# Patient Record
Sex: Female | Born: 1980 | Hispanic: Yes | Marital: Married | Smoking: Never smoker
Health system: Southern US, Community
[De-identification: ages and names within clinical notes are randomized; demographics above are authoritative.]

## PROBLEM LIST (undated history)

## (undated) HISTORY — PX: TUBAL LIGATION: SHX77

---

## 2009-04-12 ENCOUNTER — Emergency Department: Payer: Self-pay | Admitting: Emergency Medicine

## 2009-04-13 ENCOUNTER — Emergency Department: Payer: Self-pay | Admitting: Emergency Medicine

## 2009-05-08 ENCOUNTER — Ambulatory Visit: Payer: Self-pay | Admitting: Family Medicine

## 2009-08-06 ENCOUNTER — Encounter: Payer: Self-pay | Admitting: Obstetrics and Gynecology

## 2009-11-26 ENCOUNTER — Inpatient Hospital Stay: Payer: Self-pay | Admitting: Obstetrics and Gynecology

## 2010-06-20 ENCOUNTER — Emergency Department: Payer: Self-pay | Admitting: Emergency Medicine

## 2010-11-04 ENCOUNTER — Emergency Department: Payer: Self-pay | Admitting: Emergency Medicine

## 2010-11-06 ENCOUNTER — Emergency Department: Payer: Self-pay | Admitting: Emergency Medicine

## 2011-08-30 ENCOUNTER — Emergency Department: Payer: Self-pay | Admitting: Emergency Medicine

## 2011-10-02 ENCOUNTER — Encounter: Payer: Self-pay | Admitting: Obstetrics and Gynecology

## 2011-10-30 ENCOUNTER — Emergency Department: Payer: Self-pay | Admitting: *Deleted

## 2011-10-30 LAB — HEMOGLOBIN: HGB: 11 g/dL — ABNORMAL LOW (ref 12.0–16.0)

## 2011-10-30 LAB — URINALYSIS, COMPLETE
Bacteria: NONE SEEN
Bilirubin,UR: NEGATIVE
Blood: NEGATIVE
Glucose,UR: NEGATIVE mg/dL (ref 0–75)
Ketone: NEGATIVE
Leukocyte Esterase: NEGATIVE
Nitrite: NEGATIVE
Ph: 7 (ref 4.5–8.0)
Protein: NEGATIVE
RBC,UR: <1 /HPF (ref 0–5)
Specific Gravity: 1.003 (ref 1.003–1.030)
Squamous Epithelial: <1
WBC UR: <1 /HPF (ref 0–5)

## 2011-10-30 LAB — BASIC METABOLIC PANEL
Anion Gap: 14 (ref 7–16)
BUN: 8 mg/dL (ref 7–18)
Calcium, Total: 8.7 mg/dL (ref 8.5–10.1)
Chloride: 107 mmol/L (ref 98–107)
Co2: 20 mmol/L — ABNORMAL LOW (ref 21–32)
Creatinine: 0.38 mg/dL — ABNORMAL LOW (ref 0.60–1.30)
EGFR (African American): >60
EGFR (Non-African Amer.): >60
Glucose: 108 mg/dL — ABNORMAL HIGH (ref 65–99)
Osmolality: 280 (ref 275–301)
Potassium: 3.6 mmol/L (ref 3.5–5.1)
Sodium: 141 mmol/L (ref 136–145)

## 2011-10-30 LAB — HCG, QUANTITATIVE, PREGNANCY: Beta Hcg, Quant.: 17667 m[IU]/mL — ABNORMAL HIGH

## 2011-10-30 LAB — WET PREP, GENITAL

## 2012-01-06 ENCOUNTER — Encounter: Payer: Self-pay | Admitting: Pediatric Cardiology

## 2012-03-14 ENCOUNTER — Inpatient Hospital Stay: Payer: Self-pay | Admitting: Obstetrics and Gynecology

## 2012-03-14 LAB — CBC WITH DIFFERENTIAL/PLATELET
Basophil #: 0 10*3/uL (ref 0.0–0.1)
Basophil %: 0.2 %
Eosinophil #: 0 10*3/uL (ref 0.0–0.7)
Eosinophil %: 0.1 %
HCT: 39.7 % (ref 35.0–47.0)
HGB: 12.4 g/dL (ref 12.0–16.0)
Lymphocyte %: 3.7 %
MCHC: 31.4 g/dL — ABNORMAL LOW (ref 32.0–36.0)
Monocyte %: 3.5 %
Neutrophil #: 14.5 10*3/uL — ABNORMAL HIGH (ref 1.4–6.5)
Platelet: 231 10*3/uL (ref 150–440)
RBC: 5.42 10*6/uL — ABNORMAL HIGH (ref 3.80–5.20)

## 2012-03-15 LAB — HEMATOCRIT: HCT: 33.9 % — ABNORMAL LOW (ref 35.0–47.0)

## 2013-12-27 ENCOUNTER — Emergency Department: Payer: Self-pay | Admitting: Emergency Medicine

## 2015-01-09 ENCOUNTER — Emergency Department
Admission: EM | Admit: 2015-01-09 | Discharge: 2015-01-09 | Disposition: A | Discharge status: Home / Self Care | Payer: Self-pay | Attending: Emergency Medicine | Admitting: Emergency Medicine

## 2015-01-09 ENCOUNTER — Encounter: Payer: Self-pay | Admitting: Emergency Medicine

## 2015-01-09 DIAGNOSIS — R339 Retention of urine, unspecified: Secondary | ICD-10-CM | POA: Insufficient documentation

## 2015-01-09 DIAGNOSIS — R338 Other retention of urine: Secondary | ICD-10-CM

## 2015-01-09 DIAGNOSIS — Z3A08 8 weeks gestation of pregnancy: Secondary | ICD-10-CM | POA: Insufficient documentation

## 2015-01-09 DIAGNOSIS — O9989 Other specified diseases and conditions complicating pregnancy, childbirth and the puerperium: Secondary | ICD-10-CM | POA: Insufficient documentation

## 2015-01-09 LAB — URINALYSIS COMPLETE WITH MICROSCOPIC (ARMC ONLY)
BILIRUBIN URINE: NEGATIVE
Bacteria, UA: NONE SEEN
Glucose, UA: NEGATIVE mg/dL
HGB URINE DIPSTICK: NEGATIVE
Ketones, ur: NEGATIVE mg/dL
Leukocytes, UA: NEGATIVE
Nitrite: NEGATIVE
PH: 7 (ref 5.0–8.0)
PROTEIN: NEGATIVE mg/dL
Specific Gravity, Urine: 1.005 (ref 1.005–1.030)

## 2015-01-09 NOTE — ED Notes (Signed)
Placed a leg bad per Dr. Manson PasseyBrown orders.

## 2015-01-09 NOTE — ED Notes (Signed)
 urine emptied from foley drainage bag; pt reports immediate relief of pain; pt st hx of urinary retention in past and was wearing a foley for 2weeks

## 2015-01-09 NOTE — ED Notes (Signed)
Pt verbalizes understanding of discharge instructions.

## 2015-01-09 NOTE — ED Notes (Signed)
Bladder scan indicates >[id number]

## 2015-01-09 NOTE — Discharge Instructions (Signed)
Retencin urinaria aguda (Acute Urinary Retention) La retencin urinaria aguda es la incapacidad transitoria para Geographical information systems officerorinar. Es un trastorno poco frecuente Allstateen las mujeres. Las causas pueden ser:  Infeccin.  Efectos secundarios de Nature conservation officerun medicamento.  Un problema en alguno de los rganos de la zona que presionan la vejiga o la uretra (el conducto que permite la salida de la orina de la vejiga)  Problemas psicolgicos.   Ciruga en la vejiga, uretra o los rganos plvicos que causan una obstruccin en el flujo de orina de la vejiga. INSTRUCCIONES PARA EL CUIDADO EN EL HOGAR  Si van a enviarlo a su casa con un catter Foley y un sistema de drenaje, necesitar resolver cul ser el mejor curso de accin junto con su mdico. Mientras el catter est colocado, mantenga una buena ingesta de lquidos. Mantenga la bolsa de drenaje vaca y en posicin ms baja que el catter. De este modo, la orina contaminada no fluir hacia atrs, hacia la vejiga, lo que podra causar una infeccin urinaria. Hay dos tipos principales de bolsa de Lime Ridgedrenaje. Una es una bolsa grande que generalmente se utiliza por la noche. Tiene una buena capacidad, lo que permitir dormir toda la noche sin Teaching laboratory techniciantener que vaciarla. El segundo tipo se llama bolsa de pierna. Tiene menos capacidad por lo tanto necesita vaciarse con ms frecuencia. Sin embargo, la ventaja principal es que puede adherirse con una correa a la pierna y puede ir debajo de la ropa, permitiendo la libertad para moverse o salir de su casa. Slo tome medicamentos de venta libre o recetados para Primary school teachercalmar el dolor, Environmental health practitionerel malestar o bajar la [place]bre, segn las indicaciones de su mdico.  WrenSOLICITE ATENCIN MDICA SI:  Tiene fiebre baja.  Siente espasmos o prdidas de orina con los espasmos. SOLICITE ATENCIN MDICA DE INMEDIATO SI:   Siente escalofros o tiene fiebre.  El catter deja de drenar [place].  El catter se Pension scheme managersale del lugar.  Comienza a tener un aumento del sangrado que no  responde al reposo ni al aumento de la ingesta de lquidos. ASEGRESE DE QUE:  Comprende estas instrucciones.  Controlar su afeccin.  Recibir ayuda de inmediato si no mejora o si empeora. Document Released: 03/01/2007 Document Revised: 06/08/2013 [facility] Patient Information 2015 [place], [place]. This information is not intended to replace advice given to you by your health care provider. Make sure you discuss any questions you have with your health care provider.

## 2015-01-09 NOTE — ED Notes (Addendum)
Patient ambulatory to triage with steady gait, without difficulty or distress noted; pt reports difficulty urinating since Sunday with pelvic pain; pt reports EDC 12/12[facility]; G3 P2; denies any complications; denies vag bleeding or discharge; denies abd pain; pt st LMP 3/7, has had no u/s

## 2015-01-09 NOTE — ED Provider Notes (Signed)
[facility] Emergency Department Provider Note  ____________________________________________  Time seen: 5:00AM  I have reviewed the triage vital signs and the nursing notes.   HISTORY  Chief Complaint Urinary Retention      HPI Allison Perez is a 34 y.o. female   presents with inability to urinate since 11AM yesterday. Patient admits to similar episode of the same with previous pregnancies of note patient states that she is [redacted] weeks pregnant. Patient denies any dysuria patient denies any hematuria.     History reviewed. No pertinent past medical history.  There are no active problems to display for this patient.   History reviewed. No pertinent past surgical history.  No current outpatient prescriptions on file.  Allergies Review of patient's allergies indicates no known allergies.  History reviewed. No pertinent family history.  Social History History  Substance Use Topics  . Smoking status: Never Smoker   . Smokeless tobacco: Not on file  . Alcohol Use: No    Review of Systems  Constitutional: Negative for fever. Eyes: Negative for visual changes. ENT: Negative for sore throat. Cardiovascular: Negative for chest pain. Respiratory: Negative for shortness of breath. Gastrointestinal: Negative for abdominal pain, vomiting and diarrhea. Genitourinary: Positive for urinary retention Musculoskeletal: Negative for back pain. Skin: Negative for rash. Neurological: Negative for headaches, focal weakness or numbness.   10-point ROS otherwise negative.  ____________________________________________   PHYSICAL EXAM:  VITAL SIGNS: ED Triage Vitals  Enc Vitals Group     BP --      Pulse --      Resp --      Temp --      Temp src --      SpO2 --      Weight --      Height --      Head Cir --      Peak Flow --      Pain Score --      Pain Loc --      Pain Edu? --      Excl. in GC? --      Constitutional: Alert and  oriented. Well appearing and in no distress. Eyes: Conjunctivae are normal. PERRL. Normal extraocular movements. ENT   Head: Normocephalic and atraumatic.   Nose: No congestion/rhinnorhea.   Mouth/Throat: Mucous membranes are moist.   Neck: No stridor. Hematological/Lymphatic/Immunilogical: No cervical lymphadenopathy. Cardiovascular: Normal rate, regular rhythm. Normal and symmetric distal pulses are present in all extremities. No murmurs, rubs, or gallops. Respiratory: Normal respiratory effort without tachypnea nor retractions. Breath sounds are clear and equal bilaterally. No wheezes/rales/rhonchi. Gastrointestinal: Soft and nontender. No distention. There is no CVA tenderness. Positive suprapubic discomfort Genitourinary: deferred Musculoskeletal: Nontender with normal range of motion in all extremities. No joint effusions.  No lower extremity tenderness nor edema. Neurologic:  Normal speech and language. No gross focal neurologic deficits are appreciated. Speech is normal.  Skin:  Skin is warm, dry and intact. No rash noted. Psychiatric: Mood and affect are normal. Speech and behavior are normal. Patient exhibits appropriate insight and judgment.  ____________________________________________    LABS (pertinent positives/negatives)  Labs Reviewed  URINALYSIS COMPLETEWITH MICROSCOPIC ([facility])  - Abnormal; Notable for the following:    Color, Urine COLORLESS (*)    APPearance CLEAR (*)    Squamous Epithelial / LPF 0-5 (*)    All other components within normal limits     ____________________________________________   ____________________________________________    ____________________________________________   INITIAL IMPRESSION / ASSESSMENT  AND PLAN / ED COURSE  Pertinent labs & imaging results that were available during my care of the patient were reviewed by me and considered in my medical decision making (see chart for details).  Given history and  physical exam consistent with urinary retention patient had a Foley catheter placed with greater than 1000 mL of urine. Given large quantity of urine and previous episode of the same full catheter left in place and patient fitted with leg bag patient will be referred to Dr. Dalbert GarnetBeasley of note patient states she has a OB/GYN appointment on Thursday [facility].  ____________________________________________   FINAL CLINICAL IMPRESSION(S) / ED DIAGNOSES  Final diagnoses:  Acute urinary retention      Darci Currentandolph N Arabel Barcenas, MD 01/09/15 [phone]

## 2015-01-09 NOTE — H&P (Signed)
L&D Evaluation:  History:   HPI 34 y/o G3P1011 @ 39wks EDC  03/18/12 arrived with strong contractions and urge to push. Care @ [facility] + trich  RX'd GBS negative.See delivery record.    Presents with contractions    Patient's Medical History No Chronic Illness    Patient's Surgical History none    Medications Pre Natal Vitamins    Allergies NKDA    Social History none    Family History Non-Contributory   ROS:   ROS All systems were reviewed.  HEENT, CNS, GI, GU, Respiratory, CV, Renal and Musculoskeletal systems were found to be normal.   Exam:   Vital Signs stable    Urine Protein not completed    General appropriate    Mental Status clear    Chest clear    Heart normal sinus rhythm    Abdomen gravid, non-tender    Estimated Fetal Weight Large for gestational age    Fetal Position vtx    Fundal Height erm    Back no CVAT    Edema no edema    Reflexes 1+    Clonus negative    Pelvic complete with BBOW nl show vtx @ -1 station    Mebranes 0932 AROM clear    Description clear    FHT normal rate with no decels, 130's-140's avg variabillity, variable decels with peak of uc's    Fetal Heart Rate 136    Ucx regular, Q552min 60 sec strong    Skin dry    Lymph no lymphadenopathy   Impression:   Impression active labor   Plan:   Plan Delivery    Comments See delivery summary   Electronic Signatures: Albertina ParrLugiano, Ellsie Violette B (CNM)  (Signed 14-Jul-13 10:28)  Authored: L&D Evaluation   Last Updated: 14-Jul-13 10:28 by Albertina ParrLugiano, Arthurine Oleary B (CNM)

## 2018-02-02 ENCOUNTER — Emergency Department
Admission: EM | Admit: 2018-02-02 | Discharge: 2018-02-02 | Disposition: A | Discharge status: Home / Self Care | Payer: Self-pay | Attending: Emergency Medicine | Admitting: Emergency Medicine

## 2018-02-02 ENCOUNTER — Encounter: Payer: Self-pay | Admitting: Emergency Medicine

## 2018-02-02 ENCOUNTER — Other Ambulatory Visit: Payer: Self-pay

## 2018-02-02 DIAGNOSIS — B373 Candidiasis of vulva and vagina: Secondary | ICD-10-CM | POA: Insufficient documentation

## 2018-02-02 DIAGNOSIS — B3731 Acute candidiasis of vulva and vagina: Secondary | ICD-10-CM

## 2018-02-02 LAB — WET PREP, GENITAL
Clue Cells Wet Prep HPF POC: NONE SEEN
SPERM: NONE SEEN
TRICH WET PREP: NONE SEEN

## 2018-02-02 LAB — CHLAMYDIA/NGC RT PCR (ARMC ONLY)
Chlamydia Tr: NOT DETECTED
N gonorrhoeae: NOT DETECTED

## 2018-02-02 MED ORDER — FLUCONAZOLE 50 MG PO TABS
150.0000 mg | ORAL_TABLET | Freq: Once | ORAL | Status: AC
  Administered 2018-02-02: 150 mg via ORAL
  Filled 2018-02-02: qty 1

## 2018-02-02 NOTE — ED Provider Notes (Signed)
[facility] Emergency Department Provider Note   First MD Initiated Contact with Patient 02/02/18 [phone]     (approximate)  I have reviewed the triage vital signs and the nursing notes.   HISTORY  Chief Complaint Vaginal Itching    HPI Allison Perez is a 37 y.o. female presents to the emergency department with thick yellowish-white vaginal discharge.  Patient denies any dysuria.  Patient denies any dyspareunia.  She admits to being sexually active with one partner.   Past medical history None There are no active problems to display for this patient.   Past Surgical History:  Procedure Laterality Date  . CESAREAN SECTION      Prior to Admission medications   Not on File    Allergies No known drug allergies No family history on file.  Social History Social History   Tobacco Use  . Smoking status: Never Smoker  . Smokeless tobacco: Never Used  Substance Use Topics  . Alcohol use: No  . Drug use: Not on file    Review of Systems Constitutional: No fever/chills Eyes: No visual changes. ENT: No sore throat. Cardiovascular: Denies chest pain. Respiratory: Denies shortness of breath. Gastrointestinal: No abdominal pain.  No nausea, no vomiting.  No diarrhea.  No constipation. Genitourinary: Negative for dysuria.  Positive for vaginal discharge Musculoskeletal: Negative for neck pain.  Negative for back pain. Integumentary: Negative for rash. Neurological: Negative for headaches, focal weakness or numbness.   ____________________________________________   PHYSICAL EXAM:  VITAL SIGNS: ED Triage Vitals  Enc Vitals Group     BP 02/02/18 0328 122/82     Pulse Rate 02/02/18 0328 80     Resp 02/02/18 0328 18     Temp 02/02/18 0328 98.6 F (37 C)     Temp Source 02/02/18 0328 Oral     SpO2 02/02/18 0328 97 %     Weight 02/02/18 0329 61.2 kg (135 lb)     Height --      Head Circumference --      Peak Flow --      Pain Score  02/02/18 0328 0     Pain Loc --      Pain Edu? --      Excl. in GC? --     Constitutional: Alert and oriented. Well appearing and in no acute distress. Eyes: Conjunctivae are normal.  Head: Atraumatic. Mouth/Throat: Mucous membranes are moist. Neck: No stridor.   Cardiovascular: Normal rate, regular rhythm. Good peripheral circulation. Grossly normal heart sounds. Respiratory: Normal respiratory effort.  No retractions. Lungs CTAB. Gastrointestinal: Soft and nontender. No distention.  Genitourinary: Thick white vaginal discharge consistent with yeast. Musculoskeletal: No lower extremity tenderness nor edema. No gross deformities of extremities. Neurologic:  Normal speech and language. No gross focal neurologic deficits are appreciated.  Skin:  Skin is warm, dry and intact. No rash noted.   ____________________________________________   LABS (all labs ordered are listed, but only abnormal results are displayed)  Labs Reviewed  WET PREP, GENITAL - Abnormal; Notable for the following components:      Result Value   Yeast Wet Prep HPF POC PRESENT (*)    WBC, Wet Prep HPF POC TOO NUMEROUS TO COUNT (*)    All other components within normal limits  CHLAMYDIA/NGC RT PCR ([facility] ONLY)    Procedures   ____________________________________________   INITIAL IMPRESSION / ASSESSMENT AND PLAN / ED COURSE  As part of my medical decision making, I reviewed the following  data within the electronic MEDICAL RECORD NUMBER  37 year old female presented with history and physical exam consistent with yeast vaginitis.  Patient given Diflucan in the emergency department. ____________________________________________  FINAL CLINICAL IMPRESSION(S) / ED DIAGNOSES  Final diagnoses:  Yeast vaginitis     MEDICATIONS GIVEN DURING THIS VISIT:  Medications  fluconazole (DIFLUCAN) tablet 150 mg (150 mg Oral Given 02/02/18 0451)     ED Discharge Orders    None       Note:  This document  was prepared using Dragon voice recognition software and may include unintentional dictation errors.    Darci CurrentBrown, [place] N, MD 02/03/18 [phone]

## 2018-02-02 NOTE — ED Notes (Signed)
ED Provider at bedside. 

## 2018-02-02 NOTE — ED Notes (Signed)
Patient c/o vaginal itching and discharge. Patient believes she may have a yeast infection. Patient reports hx of previous yeast infection in 2013, and reports this feels similar.

## 2018-02-02 NOTE — ED Notes (Signed)
This RN utilized the spanish interpreter to discuss discharge instructions. Reviewed discharge instructions and follow-up care with patient. Patient verbalized understanding of all information reviewed. Patient stable, with no distress noted at this time.

## 2018-02-02 NOTE — ED Triage Notes (Signed)
Patient ambulatory to triage with steady gait, without difficulty or distress noted; per interpreter, pt reports since yesterday having vaginal itching; denies urinary c/o

## 2018-02-02 NOTE — ED Notes (Signed)
This RN and MD Manson PasseyBrown utilized spanish interpreter to assess patient.

## 2019-07-22 ENCOUNTER — Other Ambulatory Visit: Payer: Self-pay

## 2019-07-22 DIAGNOSIS — Z20822 Contact with and (suspected) exposure to covid-19: Secondary | ICD-10-CM

## 2019-07-24 LAB — NOVEL CORONAVIRUS, NAA: SARS-CoV-2, NAA: DETECTED — AB

## 2020-09-25 ENCOUNTER — Ambulatory Visit: Payer: Self-pay | Attending: Oncology

## 2020-09-25 VITALS — BP 116/83 | HR 92 | Temp 98.7°F | Resp 20 | Ht 61.0 in | Wt 132.7 lb

## 2020-09-25 DIAGNOSIS — N644 Mastodynia: Secondary | ICD-10-CM

## 2020-09-25 DIAGNOSIS — N6452 Nipple discharge: Secondary | ICD-10-CM

## 2020-09-25 NOTE — Progress Notes (Signed)
  Subjective:     Patient ID: Allison Perez, female   DOB: [birth year], 40 y.o.   MRN: [id number]  HPI   Review of Systems     Objective:   Physical Exam     Assessment:         Plan:

## 2020-09-25 NOTE — Progress Notes (Signed)
  Subjective:     Patient ID: Allison Perez, female   DOB: 27-Dec-1980, 40 y.o.   MRN: [id number]  HPI   Review of Systems     Objective:   Physical Exam Chest:  Breasts:     Right: Nipple discharge present. No swelling, bleeding, inverted nipple, mass, skin change or tenderness.     Left: Nipple discharge and tenderness present. No swelling, bleeding, inverted nipple, mass or skin change.      Comments: Bilateral spontaneous nipple discharge; left retroareolar tenderness.         Assessment:     40 year old Hispanic patient presents for [facility] clinic visit. Jaqui Laukaitis interpreted exam.   Patient screened, and meets [facility] eligibility.  Patient does not have insurance, Medicare or Medicaid. Instructed patient on breast self awareness using teach back method. No prior mammogram.  Reports left retro areolar tenderness. On clinical breast exam observed spontaneous left nipple discharge, and was able to express bilateral milky discharge.  Patient states at times she has retro areolar pain and expressing decreases pain.  Instructed not to express discharge. But to report to her primary provider or [facility] nurse.  Denies pregnancy, States had tubal ligation.  Risk Assessment    Risk Scores      09/25/2020   Last edited by: Jim Like, RN   5-year risk: 0.4 %   Lifetime risk: 7.2 %            Plan:     Scheduled for baseline bilateral diagnostic mammogram and ultrasound on 10/04/20.

## 2020-10-04 ENCOUNTER — Other Ambulatory Visit: Payer: Self-pay

## 2020-10-04 ENCOUNTER — Ambulatory Visit
Admission: RE | Admit: 2020-10-04 | Discharge: 2020-10-04 | Disposition: A | Discharge status: Home / Self Care | Payer: Self-pay | Source: Ambulatory Visit | Attending: Oncology | Admitting: Oncology

## 2020-10-04 DIAGNOSIS — N6452 Nipple discharge: Secondary | ICD-10-CM | POA: Insufficient documentation

## 2020-10-04 DIAGNOSIS — N644 Mastodynia: Secondary | ICD-10-CM

## 2021-01-18 NOTE — Progress Notes (Signed)
Radiologist reviewed benign findings with patient.  To return for annual screening. Copy to [facility]. 

## 2022-07-28 ENCOUNTER — Other Ambulatory Visit: Payer: Self-pay

## 2022-07-28 DIAGNOSIS — Z1231 Encounter for screening mammogram for malignant neoplasm of breast: Secondary | ICD-10-CM

## 2022-07-29 ENCOUNTER — Ambulatory Visit: Payer: Self-pay | Attending: Hematology and Oncology | Admitting: Hematology and Oncology

## 2022-07-29 ENCOUNTER — Ambulatory Visit
Admission: RE | Admit: 2022-07-29 | Discharge: 2022-07-29 | Disposition: A | Discharge status: Home / Self Care | Payer: Self-pay | Source: Ambulatory Visit | Attending: Obstetrics and Gynecology | Admitting: Obstetrics and Gynecology

## 2022-07-29 VITALS — BP 106/71 | Wt 143.6 lb

## 2022-07-29 DIAGNOSIS — Z1231 Encounter for screening mammogram for malignant neoplasm of breast: Secondary | ICD-10-CM

## 2022-07-29 NOTE — Patient Instructions (Signed)
Taught Allison Perez about breast self awareness. Patient did not need a Pap smear today due to last Pap smear was in 2023 per patient. Let her know [facility] will cover Pap smears every 5 years unless has a history of abnormal Pap smears. [facility]. Appointment scheduled for 07/29/22. Patient aware of appointment and will be there. Let patient know will follow up with her within the next couple weeks with results. Allison Perez verbalized understanding. She will return to clinic in one year for mammogram and 3 years for Pap smear.   Allison Lux, NP 9:10 AM

## 2022-07-29 NOTE — Progress Notes (Signed)
Allison Perez is a 41 y.o. female who presents to [facility] clinic today with no complaints.    Pap Smear: Pap not smear completed today. Last Pap smear was 2023 at [facility] clinic and was normal. Per patient has no history of an abnormal Pap smear. Last Pap smear result is available in Epic.   Physical exam: Breasts Breasts symmetrical. No skin abnormalities bilateral breasts. No nipple retraction bilateral breasts. No nipple discharge bilateral breasts. No lymphadenopathy. No lumps palpated bilateral breasts.     MS DIGITAL DIAG TOMO BILAT  Result Date: 10/04/2020 CLINICAL DATA:  41 year old female presenting for evaluation bilateral chronic non spontaneous milky nipple discharge an intermittent retroareolar pain. The patient states that her nipple discharge has been occurring for 5 years since the birth of her child. The retroareolar pain has been chronic as well, and cycles with her menstrual cycle. Within the last month the pain persisted after her menstrual cycle. It has since resolved and she is asymptomatic today. EXAM: DIGITAL DIAGNOSTIC BILATERAL MAMMOGRAM WITH TOMO AND CAD TECHNIQUE: Bilateral digital diagnostic mammography and breast tomosynthesis was performed. Digital images of the breasts were evaluated with computer-aided detection. COMPARISON:  None. ACR Breast Density Category c: The breast tissue is heterogeneously dense, which may obscure small masses. FINDINGS: No suspicious calcifications, masses or areas of distortion are seen in the bilateral breasts. A BB has been placed on the inferior aspect of the left breast at the site where the patient was previously experiencing pain. No suspicious findings are seen deep to this marker. IMPRESSION: 1.  No mammographic evidence of malignancy in the bilateral breasts. RECOMMENDATION: 1. Further management of nipple discharge should be based on clinical assessment. This is felt to be physiologic/related to a benign process given that it is  bilateral, non spontaneous and milky in color. The patient was advised to alert her doctor if she experiences spontaneous unilateral bloody or clear nipple discharge from a single duct only. 2. Clinical follow-up recommended for the tender area of concern in the left breast. Any further workup should be based on clinical grounds. Ultrasound was not performed today as the patient is no longer experiencing the pain. If it recurs and is concerning, targeted ultrasound may be performed. I have discussed the findings and recommendations with the patient. If applicable, a reminder letter will be sent to the patient regarding the next appointment. BI-RADS CATEGORY  1: Negative. Electronically Signed   By: Allison Perez M.D.   On: 10/04/2020 11:06      Pelvic/Bimanual Pap is not indicated today    Smoking History: Patient has never smoked and was not referred to quit line.    Patient Navigation: Patient education provided. Access to services provided for patient through [facility] program. Allison Perez interpreter provided. No transportation provided   Colorectal Cancer Screening: Per patient has never had colonoscopy completed No complaints today.    Breast and Cervical Cancer Risk Assessment: Patient does not have family history of breast cancer, known genetic mutations, or radiation treatment to the chest before age 59. Patient does not have history of cervical dysplasia, immunocompromised, or DES exposure in-utero.  Risk Scores as of 07/29/2022     Allison Perez           5-year 0.83 %   Lifetime 12.25 %   This patient is Hispana/Latina but has no documented birth country, so the Home model used data from [place] patients to calculate their risk score. Document a birth country in the Demographics  activity for a more accurate score.         Last calculated by Allison Rutherford, LPN on 67/59/1638 at  9:01 AM        A: [facility] exam without pap smear No complaints with benign exam.    P: [facility]  for a screening mammogram. Appointment scheduled 07/29/22.  Allison Perez A, NP 07/29/2022 9:07 AM

## 2022-11-11 IMAGING — MG DIGITAL DIAGNOSTIC BILAT W/ TOMO W/ CAD
6 of 10 series · 6 of 30 positions shown · non-contrast
Comparison: None.

CLINICAL DATA: 39-year-old female presenting for evaluation
bilateral chronic non spontaneous milky nipple discharge an
intermittent retroareolar pain. The patient states that her nipple
discharge has been occurring for 5 years since the birth of her
child. The retroareolar pain has been chronic as well, and cycles
with her menstrual cycle. Within the last month the pain persisted
after her menstrual cycle. It has since resolved and she is
asymptomatic today.

EXAM:
DIGITAL DIAGNOSTIC BILATERAL MAMMOGRAM WITH TOMO AND CAD
TECHNIQUE: Bilateral digital diagnostic mammography and breast tomosynthesis
was performed. Digital images of the breasts were evaluated with
computer-aided detection.

[L MLO synth-2D (1 of 2)]
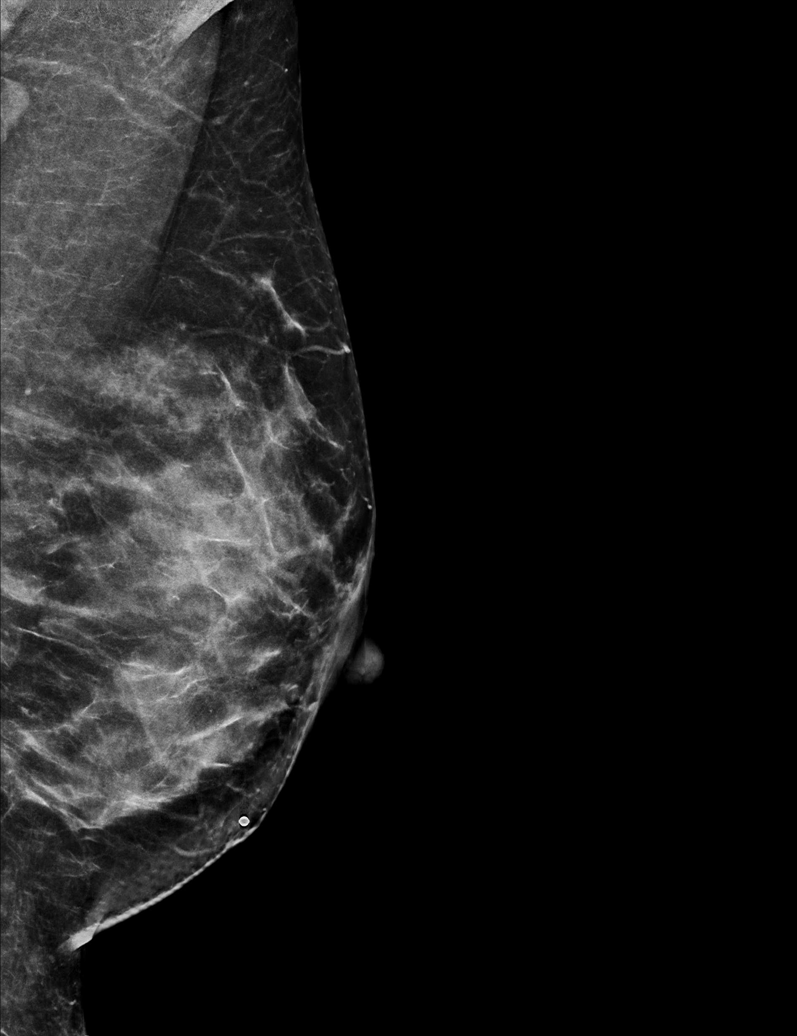

[L CC synth-2D]
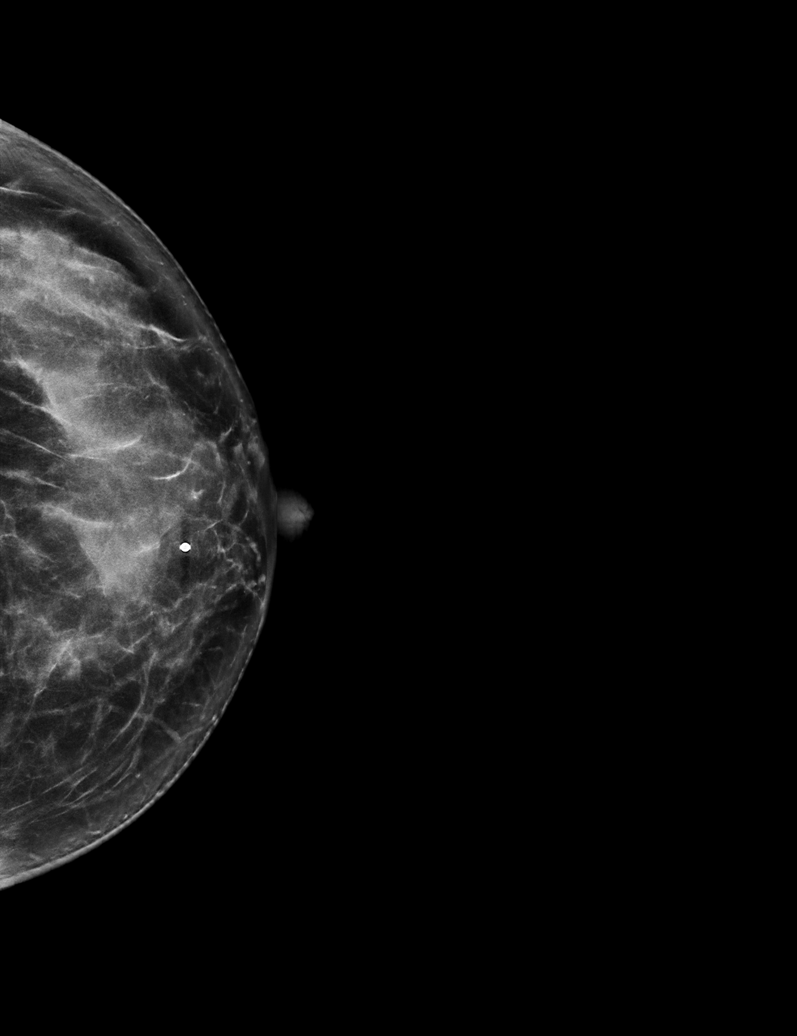

[R CC synth-2D]
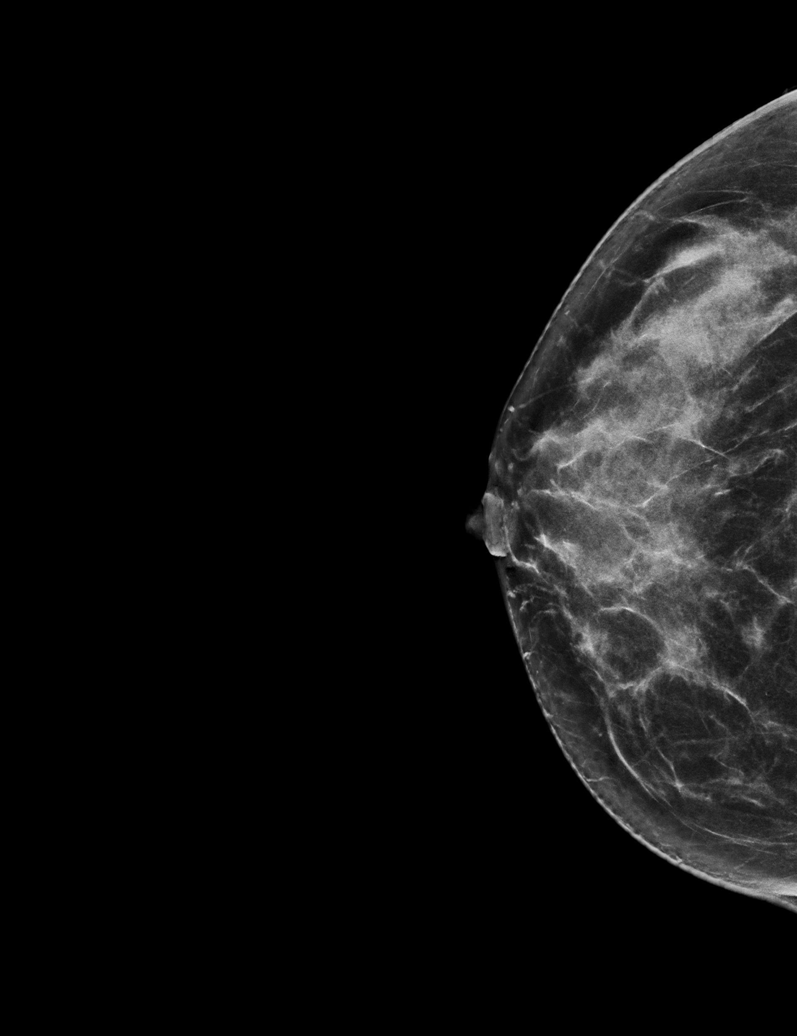

[L MLO synth-2D (2 of 2)]
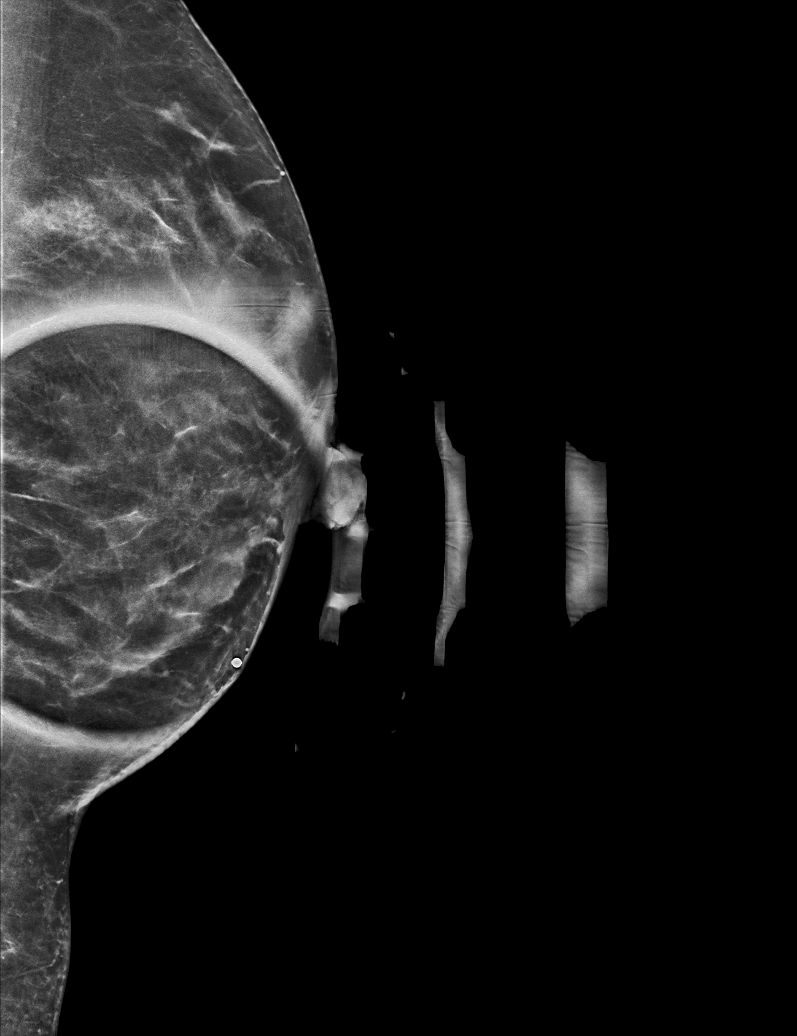

[R MLO synth-2D]
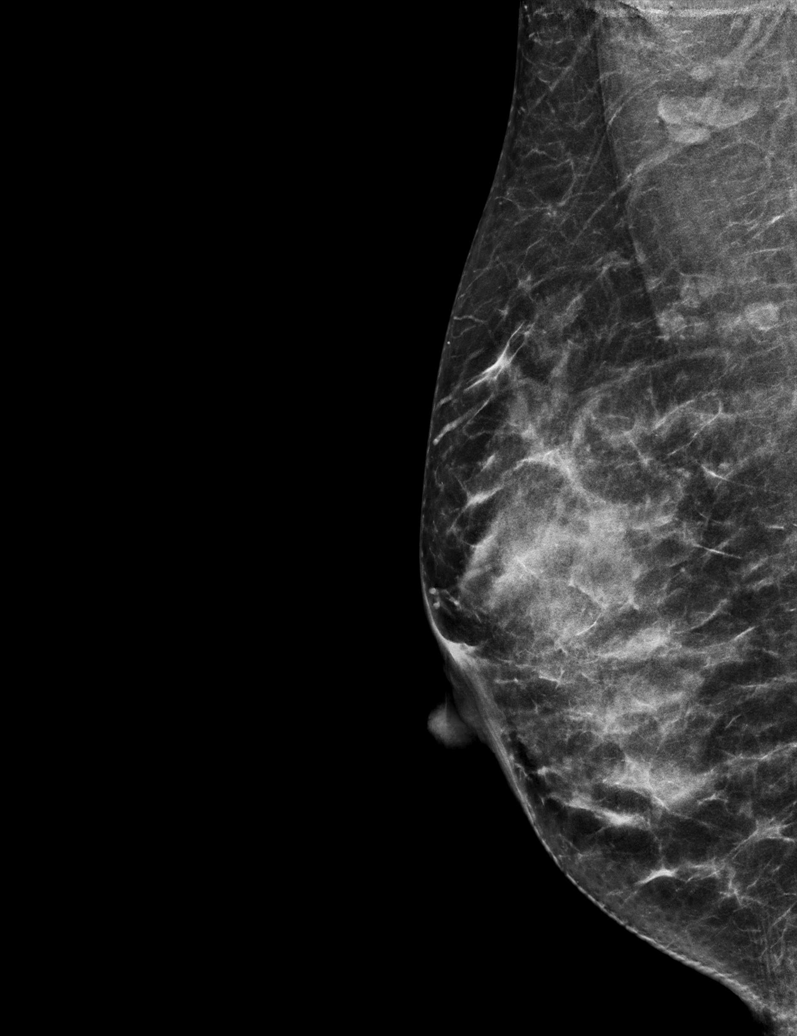

[R MLO tomo · tomo slice 31/60.0]
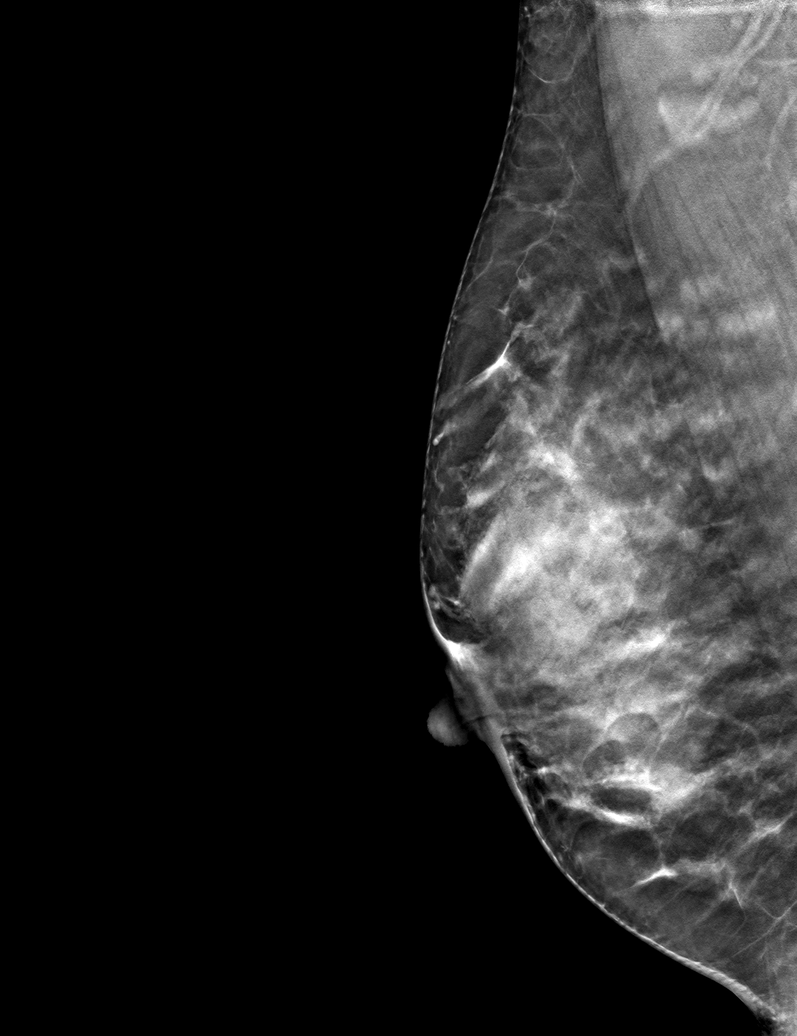

[6 of 30 positions shown; findings below may reference images not displayed]

ACR Breast Density Category c: The breast tissue is heterogeneously
dense, which may obscure small masses.
FINDINGS: No suspicious calcifications, masses or areas of distortion are seen
in the bilateral breasts. A BB has been placed on the inferior
aspect of the left breast at the site where the patient was
previously experiencing pain. No suspicious findings are seen deep
to this marker.
IMPRESSION: 1.  No mammographic evidence of malignancy in the bilateral breasts.

RECOMMENDATION:
1. Further management of nipple discharge should be based on
clinical assessment. This is felt to be physiologic/related to a
benign process given that it is bilateral, non spontaneous and milky
in color. The patient was advised to alert her doctor if she
experiences spontaneous unilateral bloody or clear nipple discharge
from a single duct only.

2. Clinical follow-up recommended for the tender area of concern in
the left breast. Any further workup should be based on clinical
grounds. Ultrasound was not performed today as the patient is no
longer experiencing the pain. If it recurs and is concerning,
targeted ultrasound may be performed.

I have discussed the findings and recommendations with the patient.
If applicable, a reminder letter will be sent to the patient
regarding the next appointment.

BI-RADS CATEGORY  1: Negative.

## 2022-12-21 ENCOUNTER — Ambulatory Visit
Admission: EM | Admit: 2022-12-21 | Discharge: 2022-12-21 | Disposition: A | Discharge status: Home / Self Care | Payer: Self-pay

## 2022-12-21 DIAGNOSIS — L03032 Cellulitis of left toe: Secondary | ICD-10-CM

## 2022-12-21 MED ORDER — CEPHALEXIN 500 MG PO CAPS
500.0000 mg | ORAL_CAPSULE | Freq: Four times a day (QID) | ORAL | 0 refills | Status: AC
Start: 2022-12-21 — End: 2022-12-28

## 2022-12-21 NOTE — ED Triage Notes (Signed)
Pt c/o L 3rd digit toe infection x2 wks. States was getting a pedicure done & lady nicked the corner of her skin which caused nail to turn black. Had some pus draining but none today.

## 2022-12-21 NOTE — ED Provider Notes (Signed)
[facility]    CSN: [id number] Arrival date & time: 12/21/22  0801      History   Chief Complaint Chief Complaint  Patient presents with   Nail Problem    HPI Allison Perez is a 42 y.o. female presents for a toe infection.  Patient does speak some English and her son was here to help translate.  Patient states 2 weeks ago she had a pedicure shortly after noticed redness and swelling and pain to her distal left third toe.  She thinks that the nail was cut too short and part of the skin was cut as well.  She does endorse some purulent drainage.  No fevers or chills.  No history of MRSA.  She does think that part of the nail looks discolored and is afraid she may lose the nail.  She has not used any OTC medications for symptoms.  No other concerns at this time.  HPI  History reviewed. No pertinent past medical history.  There are no problems to display for this patient.   Past Surgical History:  Procedure Laterality Date   CESAREAN SECTION     TUBAL LIGATION      OB History     Gravida  5   Para  3   Term  2   Preterm      AB      Living  2      SAB      IAB      Ectopic      Multiple      Live Births               Home Medications    Prior to Admission medications   Medication Sig Start Date End Date Taking? Authorizing Provider  cephALEXin (KEFLEX) 500 MG capsule Take 1 capsule (500 mg total) by mouth 4 (four) times daily for 7 days. 12/21/22 12/28/22 Yes Radford Pax, NP  cetirizine (ZYRTEC) 10 MG tablet Take 10 mg by mouth daily. 12/15/22  Yes [provider]  pantoprazole (PROTONIX) 20 MG tablet Take 20 mg by mouth daily. 12/15/22  Yes [provider]    Family History Family History  Problem Relation Age of Onset   Breast cancer Neg Hx     Social History Social History   Tobacco Use   Smoking status: Never   Smokeless tobacco: Never  Vaping Use   Vaping Use: Never used  Substance Use Topics    Alcohol use: No   Drug use: Never     Allergies   Patient has no known allergies.   Review of Systems Review of Systems  Skin:        Left third toe infection     Physical Exam Triage Vital Signs ED Triage Vitals  Enc Vitals Group     BP 12/21/22 0811 112/78     Pulse Rate 12/21/22 0811 74     Resp 12/21/22 0811 16     Temp 12/21/22 0811 98.1 F (36.7 C)     Temp Source 12/21/22 0811 Oral     SpO2 12/21/22 0811 99 %     Weight 12/21/22 0810 140 lb (63.5 kg)     Height 12/21/22 0810 5' (1.524 m)     Head Circumference --      Peak Flow --      Pain Score 12/21/22 0814 3     Pain Loc --      Pain Edu? --  Excl. in GC? --    No data found.  Updated Vital Signs BP 112/78 (BP Location: Right Arm)   Pulse 74   Temp 98.1 F (36.7 C) (Oral)   Resp 16   Ht 5' (1.524 m)   Wt 140 lb (63.5 kg)   SpO2 99%   BMI 27.34 kg/m   Visual Acuity Right Eye Distance:   Left Eye Distance:   Bilateral Distance:    Right Eye Near:   Left Eye Near:    Bilateral Near:     Physical Exam Vitals and nursing note reviewed.  Constitutional:      Appearance: Normal appearance.  HENT:     Head: Normocephalic and atraumatic.  Eyes:     Pupils: Pupils are equal, round, and reactive to light.  Cardiovascular:     Rate and Rhythm: Normal rate.  Pulmonary:     Effort: Pulmonary effort is normal.  Skin:    General: Skin is warm and dry.     Comments: There is mild erythema and swelling with minimal warmth to the medial distal left third toe.  No active drainage or fluctuance.  Small medial aspect of the toenail itself is slightly discolored but nail is attached and not lifted.  Cap refill +2.  Area is minimally tender to palpation  Neurological:     General: No focal deficit present.     Mental Status: She is alert and oriented to person, place, and time.  Psychiatric:        Mood and Affect: Mood normal.        Behavior: Behavior normal.      UC Treatments / Results   Labs (all labs ordered are listed, but only abnormal results are displayed) Labs Reviewed - No data to display  EKG   Radiology No results found.  Procedures Procedures (including critical care time)  Medications Ordered in UC Medications - No data to display  Initial Impression / Assessment and Plan / UC Course  I have reviewed the triage vital signs and the nursing notes.  Pertinent labs & imaging results that were available during my care of the patient were reviewed by me and considered in my medical decision making (see chart for details).     Reviewed exam and symptoms with patient.  No red flags Keflex for 7 days Warm soapy water soaks Avoid pedicures until symptoms completely resolved PCP follow-up if symptoms do not improve ER precautions reviewed and patient verbalized understanding  Final Clinical Impressions(s) / UC Diagnoses   Final diagnoses:  Paronychia of third toe, left     Discharge Instructions      Keflex 4 times a day for 7 days Warm soapy water soaks Follow-up with your PCP if your symptoms do not improve Please go to the ER for any worsening symptoms   ED Prescriptions     Medication Sig Dispense Auth. Provider   cephALEXin (KEFLEX) 500 MG capsule Take 1 capsule (500 mg total) by mouth 4 (four) times daily for 7 days. 28 capsule Radford Pax, NP      PDMP not reviewed this encounter.   Radford Pax, NP 12/21/22 [phone]

## 2022-12-21 NOTE — Discharge Instructions (Signed)
Keflex 4 times a day for 7 days Warm soapy water soaks Follow-up with your PCP if your symptoms do not improve Please go to the ER for any worsening symptoms

## 2024-01-08 ENCOUNTER — Other Ambulatory Visit: Payer: Self-pay | Admitting: Student

## 2024-01-08 DIAGNOSIS — R519 Headache, unspecified: Secondary | ICD-10-CM

## 2024-01-20 ENCOUNTER — Other Ambulatory Visit: Payer: Self-pay

## 2024-01-26 ENCOUNTER — Inpatient Hospital Stay: Admission: RE | Admit: 2024-01-26 | Payer: Self-pay | Source: Ambulatory Visit
